# Patient Record
Sex: Male | Born: 1980 | Hispanic: Yes | Marital: Married | Smoking: Never smoker
Health system: Southern US, Community
[De-identification: ages and names within clinical notes are randomized; demographics above are authoritative.]

## PROBLEM LIST (undated history)

## (undated) HISTORY — PX: APPENDECTOMY: SHX54

---

## 2012-03-04 ENCOUNTER — Ambulatory Visit (HOSPITAL_BASED_OUTPATIENT_CLINIC_OR_DEPARTMENT_OTHER)
Admission: RE | Admit: 2012-03-04 | Discharge: 2012-03-04 | Disposition: A | Discharge status: Home / Self Care | Payer: 59 | Source: Ambulatory Visit | Attending: Nurse Practitioner | Admitting: Nurse Practitioner

## 2012-03-04 ENCOUNTER — Other Ambulatory Visit (HOSPITAL_BASED_OUTPATIENT_CLINIC_OR_DEPARTMENT_OTHER): Payer: Self-pay | Admitting: Nurse Practitioner

## 2012-03-04 DIAGNOSIS — R109 Unspecified abdominal pain: Secondary | ICD-10-CM

## 2012-03-04 DIAGNOSIS — R1031 Right lower quadrant pain: Secondary | ICD-10-CM | POA: Insufficient documentation

## 2012-03-04 MED ORDER — IOHEXOL 300 MG/ML  SOLN
100.0000 mL | Freq: Once | INTRAMUSCULAR | Status: AC | PRN
  Administered 2012-03-04: 100 mL via INTRAVENOUS

## 2013-07-13 IMAGING — CT CT ABD-PELV W/ CM
2 of 4 series · 16 of 46 positions shown, 18 images · IV contrast (APPLIED)
Comparison: None.

CLINICAL DATA: Right lower quadrant pain.

CT ABDOMEN AND PELVIS WITH CONTRAST
TECHNIQUE: Multidetector CT imaging of the abdomen and pelvis was
performed following the standard protocol during bolus
administration of intravenous contrast.
Contrast: 100mL OMNIPAQUE IOHEXOL 300 MG/ML  SOLN

[Series 2: abd/pelvis 5.0 b31f · axial · 0.74mm/px · z∈[-508,-48]mm · 13 of 101 slices shown, 15 images]
[im 5/101  soft-tissue]
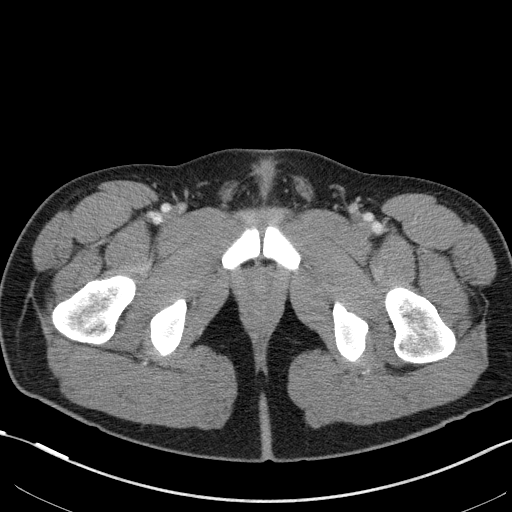
[im 5/101  bone]
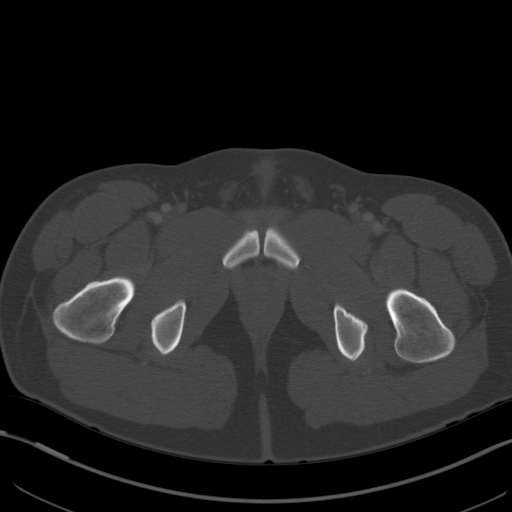
[im 13/101  soft-tissue]
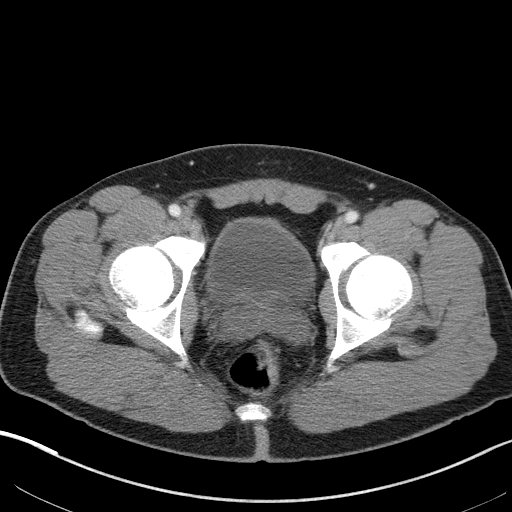
[im 21/101  soft-tissue]
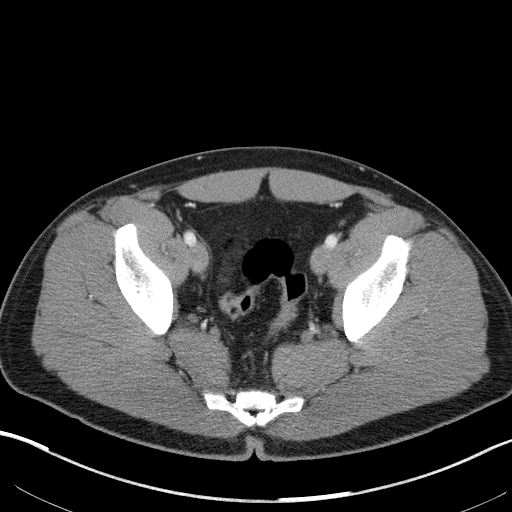
[im 29/101  soft-tissue]
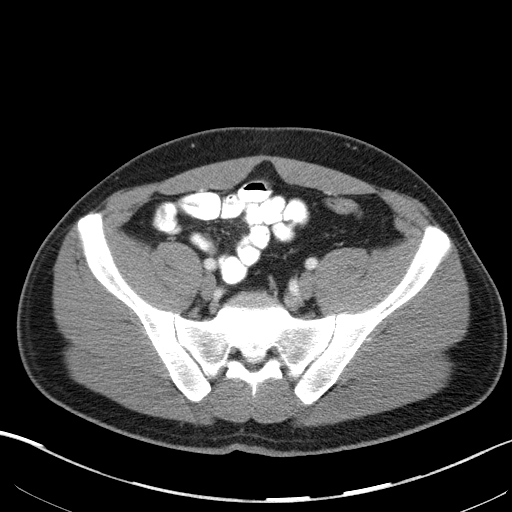
[im 37/101  soft-tissue]
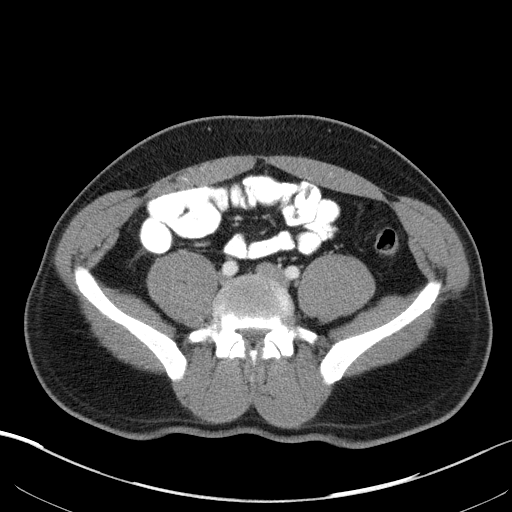
[im 45/101  soft-tissue]
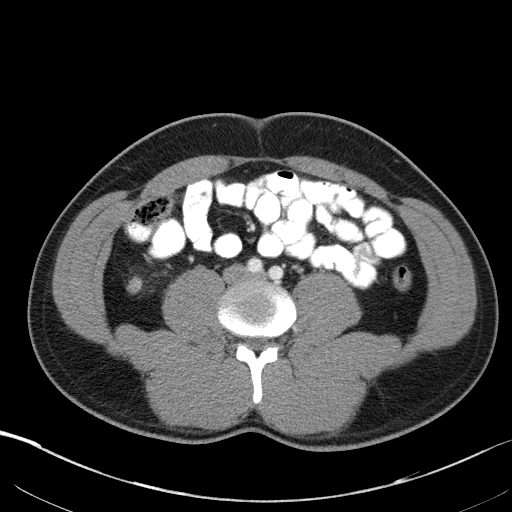
[im 53/101  soft-tissue]
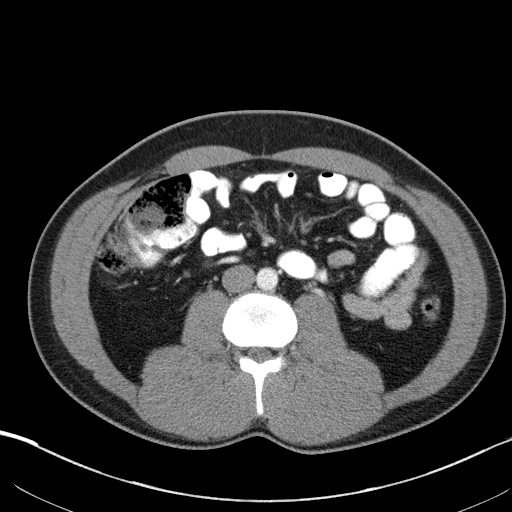
[im 57/101  soft-tissue]
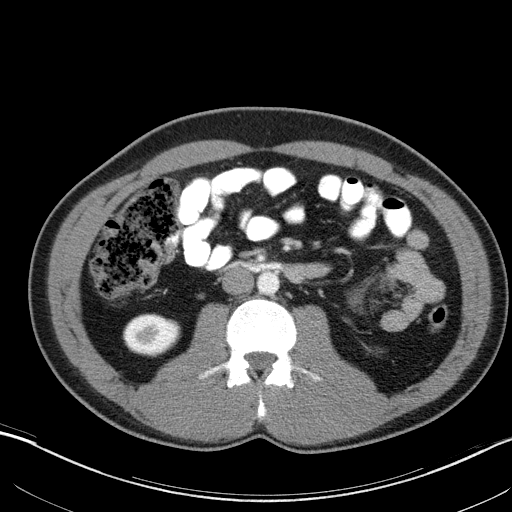
[im 65/101  soft-tissue]
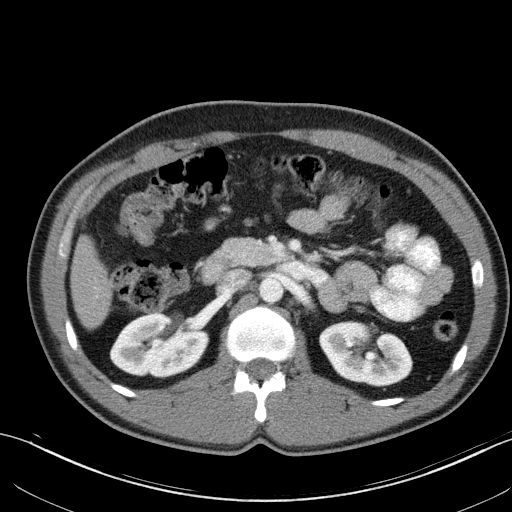
[im 65/101  bone]
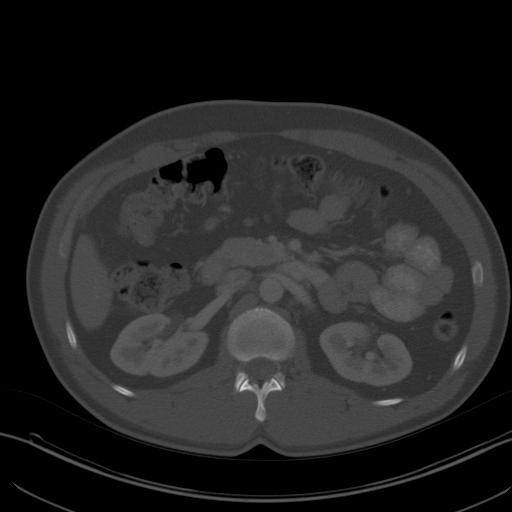
[im 73/101  soft-tissue]
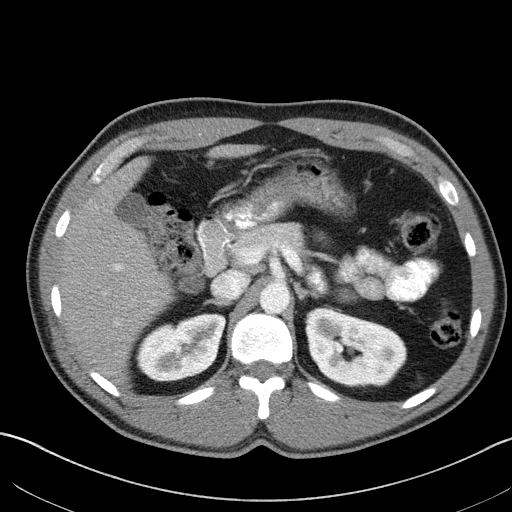
[im 81/101  soft-tissue]
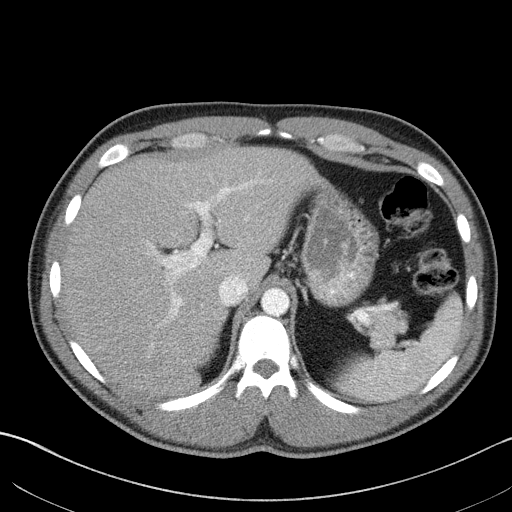
[im 89/101  soft-tissue]
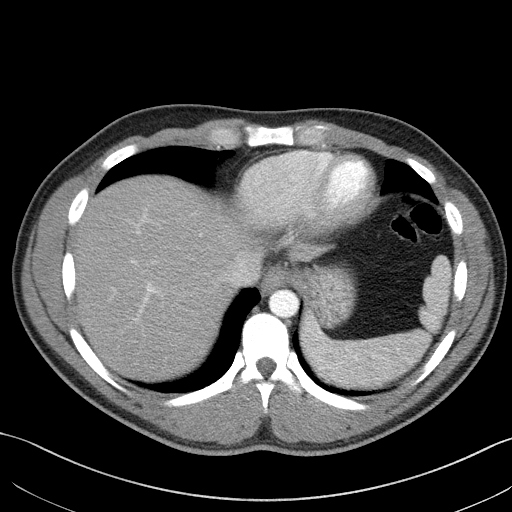
[im 97/101  soft-tissue]
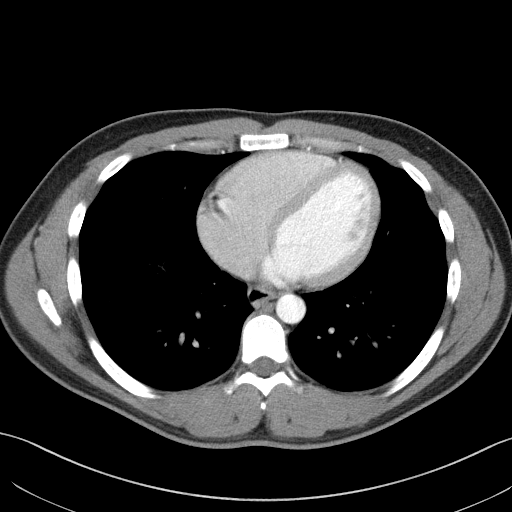

[Series 5: abd/pelvis 3.0 coronal · coronal · 0.86mm/px · 3 of 75 slices shown]
[im 25/75  soft-tissue]
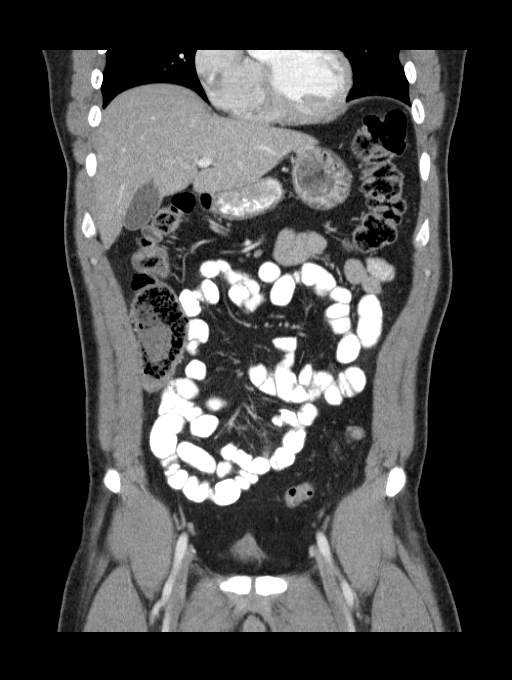
[im 33/75  soft-tissue]
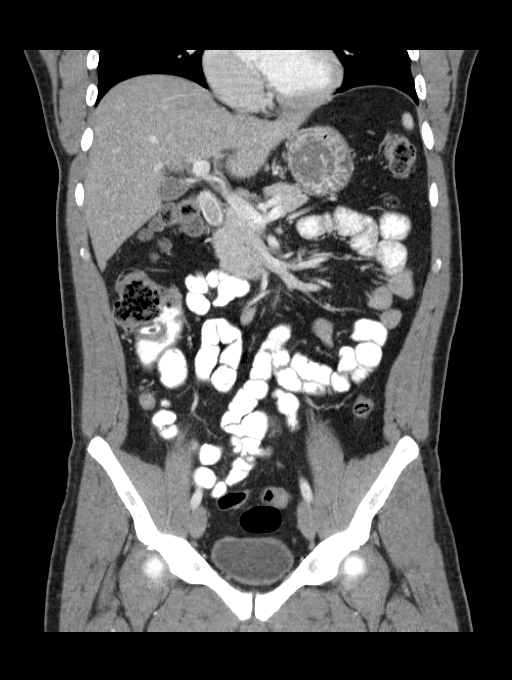
[im 42/75  soft-tissue]
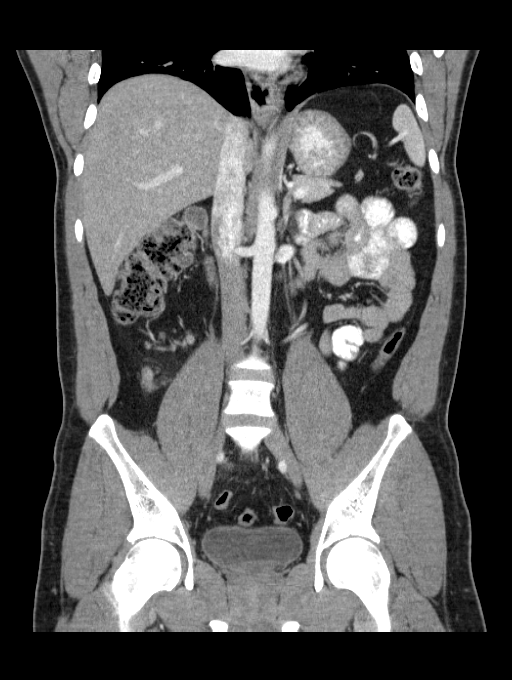

[16 of 46 positions shown; findings below may reference images not displayed]

FINDINGS: Lung bases are clear.  No effusions.  Heart is normal
size.

Liver, gallbladder, spleen, pancreas, adrenals and kidneys are
unremarkable.

The appendix is dilated and inflamed compatible with acute
appendicitis.  Multiple appendicoliths within the lumen.  Appendix
measures up to 16 mm.  Enlarged adjacent right lower quadrant
mesenteric lymph nodes.

Moderate stool throughout the colon.  Small bowel is decompressed.
No free fluid, free air or adenopathy.  Aorta is normal caliber.
No acute bony abnormality.
IMPRESSION: Findings compatible with acute appendicitis.

## 2020-03-22 ENCOUNTER — Encounter (HOSPITAL_BASED_OUTPATIENT_CLINIC_OR_DEPARTMENT_OTHER): Payer: Self-pay

## 2020-03-22 ENCOUNTER — Other Ambulatory Visit: Payer: Self-pay

## 2020-03-22 DIAGNOSIS — Z5321 Procedure and treatment not carried out due to patient leaving prior to being seen by health care provider: Secondary | ICD-10-CM | POA: Diagnosis not present

## 2020-03-22 DIAGNOSIS — U071 COVID-19: Secondary | ICD-10-CM | POA: Insufficient documentation

## 2020-03-22 DIAGNOSIS — R509 Fever, unspecified: Secondary | ICD-10-CM | POA: Diagnosis present

## 2020-03-22 NOTE — ED Triage Notes (Addendum)
Pt c/o fever that is relieved with OTC meds. States he tested + for COVID at [facility]. Symptoms started 8/23. Pt last took ibuprofen at 1700.

## 2020-03-23 ENCOUNTER — Emergency Department (HOSPITAL_BASED_OUTPATIENT_CLINIC_OR_DEPARTMENT_OTHER): Payer: HRSA Program

## 2020-03-23 ENCOUNTER — Emergency Department (HOSPITAL_BASED_OUTPATIENT_CLINIC_OR_DEPARTMENT_OTHER)
Admission: EM | Admit: 2020-03-23 | Discharge: 2020-03-23 | Disposition: A | Discharge status: Home / Self Care | Payer: HRSA Program | Attending: Emergency Medicine | Admitting: Emergency Medicine

## 2020-03-23 NOTE — ED Notes (Signed)
Called multiple times in lobby and outside. No response.

## 2020-03-24 ENCOUNTER — Other Ambulatory Visit: Payer: Self-pay

## 2020-03-24 ENCOUNTER — Emergency Department (HOSPITAL_BASED_OUTPATIENT_CLINIC_OR_DEPARTMENT_OTHER): Payer: HRSA Program

## 2020-03-24 ENCOUNTER — Encounter (HOSPITAL_BASED_OUTPATIENT_CLINIC_OR_DEPARTMENT_OTHER): Payer: Self-pay | Admitting: Emergency Medicine

## 2020-03-24 ENCOUNTER — Emergency Department (HOSPITAL_BASED_OUTPATIENT_CLINIC_OR_DEPARTMENT_OTHER)
Admission: EM | Admit: 2020-03-24 | Discharge: 2020-03-24 | Disposition: A | Discharge status: Home / Self Care | Payer: HRSA Program | Attending: Emergency Medicine | Admitting: Emergency Medicine

## 2020-03-24 DIAGNOSIS — J1282 Pneumonia due to coronavirus disease 2019: Secondary | ICD-10-CM | POA: Diagnosis not present

## 2020-03-24 DIAGNOSIS — Z79899 Other long term (current) drug therapy: Secondary | ICD-10-CM | POA: Insufficient documentation

## 2020-03-24 DIAGNOSIS — J189 Pneumonia, unspecified organism: Secondary | ICD-10-CM

## 2020-03-24 DIAGNOSIS — R509 Fever, unspecified: Secondary | ICD-10-CM | POA: Diagnosis present

## 2020-03-24 DIAGNOSIS — U071 COVID-19: Secondary | ICD-10-CM | POA: Diagnosis not present

## 2020-03-24 MED ORDER — AZITHROMYCIN 250 MG PO TABS: 250.0000 mg | ORAL_TABLET | Freq: Every day | ORAL | 0 refills | Status: AC

## 2020-03-24 MED ORDER — ONDANSETRON 4 MG PO TBDP: 4.0000 mg | ORAL_TABLET | Freq: Three times a day (TID) | ORAL | 0 refills | Status: AC | PRN

## 2020-03-24 NOTE — ED Triage Notes (Signed)
Pt here with COVID sx since the 26th. States they aren't getting better.

## 2020-03-24 NOTE — ED Provider Notes (Signed)
[facility] EMERGENCY DEPARTMENT Provider Note   CSN: [id number] Arrival date & time: 03/24/20  5809     History Chief Complaint  Patient presents with  . Cough  . Fever    Gary Estrada is a 39 y.o. male.  HPI      Fever for 5 days, shortness of breath for 5 days Fever will go away after medicine then come back Symptoms started 8/26, fever for 5 days Chest pain, began same time as the fever. Cough 10 days ago Fatigue, no appetite No sore throat Nauseas, no vomiting No diarrhea Has pain in chest with fever and cough, no current pain.    History reviewed. No pertinent past medical history.  There are no problems to display for this patient.   Past Surgical History:  Procedure Laterality Date  . APPENDECTOMY         History reviewed. No pertinent family history.  Social History   Tobacco Use  . Smoking status: Never Smoker  . Smokeless tobacco: Never Used  Vaping Use  . Vaping Use: Never used  Substance Use Topics  . Alcohol use: Yes    Comment: occ  . Drug use: Never    Home Medications Prior to Admission medications   Medication Sig Start Date End Date Taking? Authorizing Provider  atorvastatin (LIPITOR) 40 MG tablet Take by mouth. 10/11/19   [provider]  azithromycin (ZITHROMAX) 250 MG tablet Take 1 tablet (250 mg total) by mouth daily. Take first 2 tablets together, then 1 every day until finished. 03/24/20   Alvira Monday, MD  ondansetron (ZOFRAN ODT) 4 MG disintegrating tablet Take 1 tablet (4 mg total) by mouth every 8 (eight) hours as needed for nausea or vomiting. 03/24/20   Alvira Monday, MD    Allergies    Tetracycline  Review of Systems   Review of Systems  Constitutional: Positive for appetite change, fatigue and fever.  HENT: Negative for sore throat.   Eyes: Negative for visual disturbance.  Respiratory: Positive for cough and shortness of breath.   Cardiovascular: Positive for chest pain.    Gastrointestinal: Negative for abdominal pain, nausea and vomiting.  Genitourinary: Negative for difficulty urinating.  Musculoskeletal: Negative for back pain and neck stiffness.  Skin: Negative for rash.  Neurological: Negative for syncope and headaches.    Physical Exam Updated Vital Signs BP 121/80   Pulse 77   Temp 98.9 F (37.2 C) (Oral)   Resp 19   Ht 5\' 11"  (1.803 m)   Wt 83.9 kg   SpO2 96%   BMI 25.80 kg/m   Physical Exam Vitals and nursing note reviewed.  Constitutional:      General: He is not in acute distress.    Appearance: He is well-developed. He is not diaphoretic.  HENT:     Head: Normocephalic and atraumatic.  Eyes:     Conjunctiva/sclera: Conjunctivae normal.  Cardiovascular:     Rate and Rhythm: Normal rate and regular rhythm.     Heart sounds: Normal heart sounds. No murmur heard.  No friction rub. No gallop.   Pulmonary:     Effort: Pulmonary effort is normal. No respiratory distress.     Breath sounds: Rales (left) present. No wheezing.  Abdominal:     General: There is no distension.     Palpations: Abdomen is soft.     Tenderness: There is no abdominal tenderness. There is no guarding.  Musculoskeletal:     Cervical back: Normal range of motion.  Skin:    General: Skin is warm and dry.  Neurological:     Mental Status: He is alert and oriented to person, place, and time.     ED Results / Procedures / Treatments   Labs (all labs ordered are listed, but only abnormal results are displayed) Labs Reviewed - No data to display  EKG EKG Interpretation  Date/Time:  Sunday March 24 2020 10:03:59 EDT Ventricular Rate:  73 PR Interval:    QRS Duration: 88 QT Interval:  363 QTC Calculation: 400 R Axis:   85 Text Interpretation: Sinus rhythm No previous ECGs available Confirmed by Cystal Shannahan ([id number]) on 03/24/2020 10:22:48 AM   Radiology DG Chest Portable 1 View  Result Date: 03/24/2020 CLINICAL DATA:  COVID-19 infection.   Cough and shortness of breath. EXAM: PORTABLE CHEST 1 VIEW COMPARISON:  None. FINDINGS: Normal cardiac silhouette and mediastinal contours. Ill-defined nodular airspace opacities are seen within the left mid and lower lung. The right lung remains well aerated. No pleural effusion or pneumothorax. No evidence of edema. No acute osseous abnormalities. IMPRESSION: Left mid and lower lung nodular airspace opacities, nonspecific though compatible with provided history of COVID-19 infection. Electronically Signed   By: John  Watts M.D.   On: 03/24/2020 10:11    Procedures Procedures (including critical care time)  Medications Ordered in ED Medications - No data to display  ED Course  I have reviewed the triage vital signs and the nursing notes.  Pertinent labs & imaging results that were available during my care of the patient were reviewed by me and considered in my medical decision making (see chart for details).    MDM Rules/Calculators/A&P                          39yo male with history of COVID+ for 10 days presents with concern for continuing symptoms of cough, fever, fatigue, loss of appetite. EKG without acute findings. By history doubt ACS, lower suspicion for PE given no hypoxia or tachycardia. XR with possible pneumonia. Discussed symptoms likely viral and within expected course of COVID 19--however difficult to rule out additional bacterial infection given asymmetry on xr/symptoms for 10 days and given azithromycin for possible CAP. No hypoxia or indication for admission.  Recommend continued supportive care.     Final Clinical Impression(s) / ED Diagnoses Final diagnoses:  COVID-19  Community acquired pneumonia of left lower lobe of lung    Rx / DC Orders ED Discharge Orders         Ordered    azithromycin (ZITHROMAX) 250 MG tablet  Daily        03/24/20 1112    ondansetron (ZOFRAN ODT) 4 MG disintegrating tablet  Every 8 hours PRN        09 /05/21 1112             05-25-1984, MD 03/25/20 [phone]
